# Patient Record
Sex: Male | Born: 2000 | Race: Black or African American | Hispanic: No | Marital: Single | Smoking: Never smoker
Health system: Southern US, Community
[De-identification: ages and names within clinical notes are randomized; demographics above are authoritative.]

---
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
## 2023-10-14 ENCOUNTER — Encounter (HOSPITAL_COMMUNITY): Payer: Self-pay

## 2023-10-14 ENCOUNTER — Emergency Department (HOSPITAL_COMMUNITY)
Admission: EM | Admit: 2023-10-14 | Discharge: 2023-10-14 | Disposition: A | Discharge status: Home / Self Care | Payer: Medicaid Other | Attending: Emergency Medicine | Admitting: Emergency Medicine

## 2023-10-14 ENCOUNTER — Other Ambulatory Visit: Payer: Self-pay

## 2023-10-14 ENCOUNTER — Emergency Department (HOSPITAL_COMMUNITY): Payer: Medicaid Other

## 2023-10-14 DIAGNOSIS — R6883 Chills (without fever): Secondary | ICD-10-CM | POA: Insufficient documentation

## 2023-10-14 DIAGNOSIS — R1032 Left lower quadrant pain: Secondary | ICD-10-CM | POA: Insufficient documentation

## 2023-10-14 DIAGNOSIS — R112 Nausea with vomiting, unspecified: Secondary | ICD-10-CM | POA: Diagnosis not present

## 2023-10-14 LAB — CBC WITH DIFFERENTIAL/PLATELET
Abs Immature Granulocytes: 0 10*3/uL (ref 0.00–0.07)
Basophils Absolute: 0.1 10*3/uL (ref 0.0–0.1)
Basophils Relative: 1 %
Eosinophils Absolute: 0.4 10*3/uL (ref 0.0–0.5)
Eosinophils Relative: 6 %
HCT: 44.7 % (ref 39.0–52.0)
Hemoglobin: 15.8 g/dL (ref 13.0–17.0)
Lymphocytes Relative: 27 %
Lymphs Abs: 1.7 10*3/uL (ref 0.7–4.0)
MCH: 32.5 pg (ref 26.0–34.0)
MCHC: 35.3 g/dL (ref 30.0–36.0)
MCV: 92 fL (ref 80.0–100.0)
Monocytes Absolute: 0.6 10*3/uL (ref 0.1–1.0)
Monocytes Relative: 9 %
Neutro Abs: 3.5 10*3/uL (ref 1.7–7.7)
Neutrophils Relative %: 57 %
Platelets: 366 10*3/uL (ref 150–400)
RBC: 4.86 MIL/uL (ref 4.22–5.81)
RDW: 14.3 % (ref 11.5–15.5)
WBC: 6.2 10*3/uL (ref 4.0–10.5)
nRBC: 0 % (ref 0.0–0.2)
nRBC: 0 /100{WBCs}

## 2023-10-14 LAB — URINALYSIS, ROUTINE W REFLEX MICROSCOPIC
Bilirubin Urine: NEGATIVE
Glucose, UA: NEGATIVE mg/dL
Hgb urine dipstick: NEGATIVE
Ketones, ur: 80 mg/dL — AB
Leukocytes,Ua: NEGATIVE
Nitrite: NEGATIVE
Protein, ur: 30 mg/dL — AB
Specific Gravity, Urine: 1.026 (ref 1.005–1.030)
pH: 6 (ref 5.0–8.0)

## 2023-10-14 LAB — I-STAT CHEM 8, ED
BUN: 6 mg/dL (ref 6–20)
BUN: 4 mg/dL — ABNORMAL LOW (ref 6–20)
Calcium, Ion: 0.88 mmol/L — CL (ref 1.15–1.40)
Calcium, Ion: 1.14 mmol/L — ABNORMAL LOW (ref 1.15–1.40)
Chloride: 102 mmol/L (ref 98–111)
Chloride: 100 mmol/L (ref 98–111)
Creatinine, Ser: 0.9 mg/dL (ref 0.61–1.24)
Creatinine, Ser: 0.9 mg/dL (ref 0.61–1.24)
Glucose, Bld: 92 mg/dL (ref 70–99)
Glucose, Bld: 92 mg/dL (ref 70–99)
HCT: 44 % (ref 39.0–52.0)
HCT: 47 % (ref 39.0–52.0)
Hemoglobin: 15 g/dL (ref 13.0–17.0)
Hemoglobin: 16 g/dL (ref 13.0–17.0)
Potassium: 8.5 mmol/L (ref 3.5–5.1)
Potassium: 3.5 mmol/L (ref 3.5–5.1)
Sodium: 130 mmol/L — ABNORMAL LOW (ref 135–145)
Sodium: 138 mmol/L (ref 135–145)
TCO2: 27 mmol/L (ref 22–32)
TCO2: 26 mmol/L (ref 22–32)

## 2023-10-14 LAB — COMPREHENSIVE METABOLIC PANEL
ALT: 59 U/L — ABNORMAL HIGH (ref 0–44)
AST: 67 U/L — ABNORMAL HIGH (ref 15–41)
Albumin: 3.8 g/dL (ref 3.5–5.0)
Alkaline Phosphatase: 83 U/L (ref 38–126)
Anion gap: 14 (ref 5–15)
BUN: <5 mg/dL — ABNORMAL LOW (ref 6–20)
CO2: 23 mmol/L (ref 22–32)
Calcium: 9.3 mg/dL (ref 8.9–10.3)
Chloride: 100 mmol/L (ref 98–111)
Creatinine, Ser: 0.95 mg/dL (ref 0.61–1.24)
GFR, Estimated: >60 mL/min (ref >60)
Glucose, Bld: 95 mg/dL (ref 70–99)
Potassium: 3.5 mmol/L (ref 3.5–5.1)
Sodium: 137 mmol/L (ref 135–145)
Total Bilirubin: 1.2 mg/dL — ABNORMAL HIGH (ref <1.2)
Total Protein: 7.3 g/dL (ref 6.5–8.1)

## 2023-10-14 LAB — LIPASE, BLOOD: Lipase: 23 U/L (ref 11–51)

## 2023-10-14 MED ORDER — ONDANSETRON HCL 4 MG/2ML IJ SOLN
4.0000 mg | Freq: Once | INTRAMUSCULAR | Status: AC
  Administered 2023-10-14: 4 mg via INTRAVENOUS
  Filled 2023-10-14: qty 2

## 2023-10-14 MED ORDER — IOHEXOL 350 MG/ML SOLN
75.0000 mL | Freq: Once | INTRAVENOUS | Status: AC | PRN
  Administered 2023-10-14: 75 mL via INTRAVENOUS

## 2023-10-14 NOTE — ED Notes (Signed)
Istat CG4 lactic ordered in error, lab not done

## 2023-10-14 NOTE — ED Triage Notes (Signed)
Pt BIBEMS w/ c/o abd pain w/ N/V, as per pt sx started earlier tonight. Pt seen yesterday at [facility] & Dx w/ Covid & gastritis

## 2023-10-14 NOTE — ED Provider Notes (Signed)
[place] EMERGENCY DEPARTMENT AT [facility] Provider Note   CSN: [id number] Arrival date & time: 10/14/23  9528     History  Chief Complaint  Patient presents with   Abdominal Pain    William Lambert is a 22 y.o. male.  Patient presents to the emergency room complaining of left lower quadrant abdominal pain with associated nausea and vomiting.  He denies diarrhea and endorses constipation over the past 5 days.  He was seen earlier today at urgent care for the same and endorsed chills.  He was tested for COVID and was positive.  He was prescribed Zofran but has not filled the prescription and has had no antiemetic medication.  Patient denies urinary symptoms, headaches, chest pain, shortness of breath.  The patient does endorse marijuana usage with his most recent usage being 6 hours prior to arrival.  Past medical history otherwise noncontributory.   Abdominal Pain      Home Medications Prior to Admission medications   Not on File      Allergies    Coconut (cocos nucifera)    Review of Systems   Review of Systems  Gastrointestinal:  Positive for abdominal pain.    Physical Exam Updated Vital Signs BP 136/88 (BP Location: Right Arm)   Pulse 79   Temp 99 F (37.2 C) (Oral)   Resp 19   Ht 6' (1.829 m)   Wt 61.2 kg   SpO2 100%   BMI 18.31 kg/m  Physical Exam Vitals and nursing note reviewed.  Constitutional:      General: He is not in acute distress.    Appearance: He is well-developed.  HENT:     Head: Normocephalic and atraumatic.  Eyes:     Conjunctiva/sclera: Conjunctivae normal.  Cardiovascular:     Rate and Rhythm: Normal rate and regular rhythm.     Heart sounds: No murmur heard. Pulmonary:     Effort: Pulmonary effort is normal. No respiratory distress.     Breath sounds: Normal breath sounds.  Abdominal:     Palpations: Abdomen is soft.     Tenderness: There is abdominal tenderness in the left lower quadrant.  Musculoskeletal:         General: No swelling.     Cervical back: Neck supple.  Skin:    General: Skin is warm and dry.     Capillary Refill: Capillary refill takes less than 2 seconds.  Neurological:     Mental Status: He is alert.  Psychiatric:        Mood and Affect: Mood normal.     ED Results / Procedures / Treatments   Labs (all labs ordered are listed, but only abnormal results are displayed) Labs Reviewed  URINALYSIS, ROUTINE W REFLEX MICROSCOPIC - Abnormal; Notable for the following components:      Result Value   Color, Urine AMBER (*)    Ketones, ur 80 (*)    Protein, ur 30 (*)    Bacteria, UA RARE (*)    All other components within normal limits  I-STAT CHEM 8, ED - Abnormal; Notable for the following components:   Sodium 130 (*)    Potassium 8.5 (*)    Calcium, Ion 0.88 (*)    All other components within normal limits  I-STAT CHEM 8, ED - Abnormal; Notable for the following components:   BUN 4 (*)    Calcium, Ion 1.14 (*)    All other components within normal limits  CBC WITH DIFFERENTIAL/PLATELET  COMPREHENSIVE METABOLIC PANEL  LIPASE, BLOOD  I-STAT CG4 LACTIC ACID, ED    EKG EKG Interpretation Date/Time:  Wednesday October 14 2023 05:11:54 EST Ventricular Rate:  74 PR Interval:  105 QRS Duration:  94 QT Interval:  355 QTC Calculation: 394 R Axis:   84  Text Interpretation: Sinus or ectopic atrial rhythm Short PR interval RSR' in V1 or V2, probably normal variant Interpretation limited secondary to artifact Confirmed by Zadie Rhine ([id number]) on 10/14/2023 5:14:47 AM  Radiology No results found.  Procedures Procedures    Medications Ordered in ED Medications  ondansetron (ZOFRAN) injection 4 mg (4 mg Intravenous Given 10/14/23 0416)  iohexol (OMNIPAQUE) 350 MG/ML injection 75 mL (75 mLs Intravenous Contrast Given 10/14/23 0536)    ED Course/ Medical Decision Making/ A&P                                 Medical Decision Making Amount and/or Complexity of Data  Reviewed Labs: ordered. Radiology: ordered. ECG/medicine tests: ordered.  Risk Prescription drug management.   This patient presents to the ED for concern of abdominal pain with nausea and vomiting, this involves an extensive number of treatment options, and is a complaint that carries with it a high risk of complications and morbidity.  The differential diagnosis includes appendicitis, gastroenteritis, gastritis, COVID-19, cannabinoid hyperemesis, others   Co morbidities that complicate the patient evaluation  Marijuana usage   Additional history obtained:   External records from outside source obtained and reviewed including urgent care notes   Lab Tests:  I Ordered, and personally interpreted labs.  The pertinent results include: Grossly unremarkable Chem-8, CBC   Imaging Studies ordered:  I ordered imaging studies including CT abdomen pelvis with contrast Imaging pending   Cardiac Monitoring: / EKG:  The patient was maintained on a cardiac monitor.  I personally viewed and interpreted the cardiac monitored which showed an underlying rhythm of: Sinus rhythm   Problem List / ED Course / Critical interventions / Medication management   I ordered medication including Zofran for nausea Reevaluation of the patient after these medicines showed that the patient improved I have reviewed the patients home medicines and have made adjustments as needed   Social Determinants of Health:  Patient has Medicaid for his primary health insurance type   Test / Admission - Considered:  Patient care being signed out to Dr.Wickline. Disposition pending results of CT abdomen. If negative for acute/surgical process patient can likely discharge home after PO challenge. Patient was prescribed Zofran at urgent care visit earlier in the day.          Final Clinical Impression(s) / ED Diagnoses Final diagnoses:  Left lower quadrant abdominal pain  Nausea and vomiting,  unspecified vomiting type    Rx / DC Orders ED Discharge Orders     None         Pamala Duffel 10/14/23 0539    Zadie Rhine, MD 10/14/23 [phone]
